# Patient Record
Sex: Female | Born: 1995 | Race: Black or African American | Hispanic: No | Marital: Single | Smoking: Never smoker
Health system: Southern US, Community
[De-identification: ages and names within clinical notes are randomized; demographics above are authoritative.]

## PROBLEM LIST (undated history)

## (undated) DIAGNOSIS — F419 Anxiety disorder, unspecified: Secondary | ICD-10-CM

## (undated) DIAGNOSIS — K219 Gastro-esophageal reflux disease without esophagitis: Secondary | ICD-10-CM

## (undated) HISTORY — DX: Anxiety disorder, unspecified: F41.9

---
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
## 2015-12-27 ENCOUNTER — Emergency Department (HOSPITAL_COMMUNITY)

## 2015-12-27 ENCOUNTER — Encounter (HOSPITAL_COMMUNITY): Payer: Self-pay | Admitting: Emergency Medicine

## 2015-12-27 ENCOUNTER — Emergency Department (HOSPITAL_COMMUNITY)
Admission: EM | Admit: 2015-12-27 | Discharge: 2015-12-27 | Disposition: A | Discharge status: Home / Self Care | Attending: Emergency Medicine | Admitting: Emergency Medicine

## 2015-12-27 DIAGNOSIS — N83209 Unspecified ovarian cyst, unspecified side: Secondary | ICD-10-CM | POA: Diagnosis not present

## 2015-12-27 DIAGNOSIS — N898 Other specified noninflammatory disorders of vagina: Secondary | ICD-10-CM | POA: Insufficient documentation

## 2015-12-27 DIAGNOSIS — R1012 Left upper quadrant pain: Secondary | ICD-10-CM | POA: Diagnosis present

## 2015-12-27 DIAGNOSIS — R1030 Lower abdominal pain, unspecified: Secondary | ICD-10-CM

## 2015-12-27 LAB — CBC
HCT: 38.7 % (ref 36.0–46.0)
Hemoglobin: 12.5 g/dL (ref 12.0–15.0)
MCH: 29.7 pg (ref 26.0–34.0)
MCHC: 32.3 g/dL (ref 30.0–36.0)
MCV: 91.9 fL (ref 78.0–100.0)
PLATELETS: 300 10*3/uL (ref 150–400)
RBC: 4.21 MIL/uL (ref 3.87–5.11)
RDW: 13.9 % (ref 11.5–15.5)
WBC: 6.6 10*3/uL (ref 4.0–10.5)

## 2015-12-27 LAB — URINALYSIS, ROUTINE W REFLEX MICROSCOPIC
BILIRUBIN URINE: NEGATIVE
Glucose, UA: NEGATIVE mg/dL
Hgb urine dipstick: NEGATIVE
KETONES UR: NEGATIVE mg/dL
NITRITE: NEGATIVE
Protein, ur: NEGATIVE mg/dL
Specific Gravity, Urine: 1.017 (ref 1.005–1.030)
pH: 7 (ref 5.0–8.0)

## 2015-12-27 LAB — COMPREHENSIVE METABOLIC PANEL
ALBUMIN: 4.2 g/dL (ref 3.5–5.0)
ALK PHOS: 54 U/L (ref 38–126)
ALT: 15 U/L (ref 14–54)
ANION GAP: 9 (ref 5–15)
AST: 22 U/L (ref 15–41)
BILIRUBIN TOTAL: 0.5 mg/dL (ref 0.3–1.2)
BUN: 11 mg/dL (ref 6–20)
CALCIUM: 9.7 mg/dL (ref 8.9–10.3)
CO2: 24 mmol/L (ref 22–32)
Chloride: 106 mmol/L (ref 101–111)
Creatinine, Ser: 0.92 mg/dL (ref 0.44–1.00)
GFR calc Af Amer: >60 mL/min (ref >60)
GFR calc non Af Amer: >60 mL/min (ref >60)
GLUCOSE: 89 mg/dL (ref 65–99)
Potassium: 3.4 mmol/L — ABNORMAL LOW (ref 3.5–5.1)
Sodium: 139 mmol/L (ref 135–145)
TOTAL PROTEIN: 7.4 g/dL (ref 6.5–8.1)

## 2015-12-27 LAB — URINE MICROSCOPIC-ADD ON: RBC / HPF: NONE SEEN RBC/hpf (ref 0–5)

## 2015-12-27 LAB — I-STAT BETA HCG BLOOD, ED (MC, WL, AP ONLY)

## 2015-12-27 LAB — WET PREP, GENITAL
Clue Cells Wet Prep HPF POC: NONE SEEN
Sperm: NONE SEEN
Trich, Wet Prep: NONE SEEN
Yeast Wet Prep HPF POC: NONE SEEN

## 2015-12-27 LAB — LIPASE, BLOOD: Lipase: 40 U/L (ref 11–51)

## 2015-12-27 MED ORDER — HYDROCODONE-ACETAMINOPHEN 5-325 MG PO TABS: 1.0000 | ORAL_TABLET | Freq: Four times a day (QID) | ORAL | 0 refills | Status: AC | PRN

## 2015-12-27 MED ORDER — MORPHINE SULFATE (PF) 4 MG/ML IV SOLN
4.0000 mg | Freq: Once | INTRAVENOUS | Status: AC
  Administered 2015-12-27: 4 mg via INTRAVENOUS
  Filled 2015-12-27: qty 1

## 2015-12-27 MED ORDER — IOPAMIDOL (ISOVUE-300) INJECTION 61%
INTRAVENOUS | Status: AC
  Administered 2015-12-27: 100 mL
  Filled 2015-12-27: qty 100

## 2015-12-27 MED ORDER — MORPHINE SULFATE (PF) 4 MG/ML IV SOLN
4.0000 mg | Freq: Once | INTRAVENOUS | Status: AC
Start: 2015-12-27 — End: 2015-12-27
  Administered 2015-12-27: 4 mg via INTRAVENOUS
  Filled 2015-12-27: qty 1

## 2015-12-27 MED ORDER — ONDANSETRON HCL 4 MG/2ML IJ SOLN
4.0000 mg | Freq: Once | INTRAMUSCULAR | Status: AC
  Administered 2015-12-27: 4 mg via INTRAVENOUS
  Filled 2015-12-27: qty 2

## 2015-12-27 MED ORDER — NAPROXEN 500 MG PO TABS: 500.0000 mg | ORAL_TABLET | Freq: Two times a day (BID) | ORAL | 0 refills | Status: AC

## 2015-12-27 MED ORDER — GI COCKTAIL ~~LOC~~
30.0000 mL | Freq: Once | ORAL | Status: AC
  Administered 2015-12-27: 30 mL via ORAL
  Filled 2015-12-27: qty 30

## 2015-12-27 MED ORDER — SODIUM CHLORIDE 0.9 % IV BOLUS (SEPSIS)
1000.0000 mL | Freq: Once | INTRAVENOUS | Status: AC
  Administered 2015-12-27: 1000 mL via INTRAVENOUS

## 2015-12-27 MED ORDER — KETOROLAC TROMETHAMINE 15 MG/ML IJ SOLN
15.0000 mg | Freq: Once | INTRAMUSCULAR | Status: AC
  Administered 2015-12-27: 15 mg via INTRAVENOUS
  Filled 2015-12-27: qty 1

## 2015-12-27 NOTE — ED Provider Notes (Signed)
[facility]-EMERGENCY DEPT Provider Note   CSN: [id number] Arrival date & time: 12/27/15  [id number]  First Provider Contact:  First MD Initiated Contact with Patient 12/27/15 1001        History   Chief Complaint Chief Complaint  Patient presents with  . Abdominal Pain    HPI Pam Henson is a 20 y.o. female.  HPI Previously healthy 20 year old female who presents with a one-day history of severe generalized abdominal pain. The patient states she was in her usual state of health yesterday. She played in a lacrosse game and denies any issues. Denies any trauma to the abdomen. Upon awakening this morning she reports acute onset of generalized, primarily left upper quadrant and periumbilical abdominal pain. She describes the pain as an aching, cramping sensation. She had a bowel movement that was normal with no improvement in her pain. She tried to eat something and this made her pain worse. She subsequently presents for further evaluation. Denies any fevers or chills. Denies any current lower abdominal pain. Denies any urinary frequency or dysuria but has noticed that her urine has been darker. No vaginal bleeding or discharge.  Past Medical History:  Diagnosis Date  . Acid reflux   . Anxiety     There are no active problems to display for this patient.   History reviewed. No pertinent surgical history.  OB History    No data available       Home Medications    Prior to Admission medications   Medication Sig Start Date End Date Taking? Authorizing Provider  ibuprofen (ADVIL,MOTRIN) 200 MG tablet Take 200 mg by mouth every 6 (six) hours as needed for moderate pain.   Yes Historical Provider, MD  naproxen sodium (ANAPROX) 220 MG tablet Take 220 mg by mouth 2 (two) times daily as needed (pain).   Yes Historical Provider, MD  HYDROcodone-acetaminophen (NORCO/VICODIN) 5-325 MG tablet Take 1 tablet by mouth every 6 (six) hours as needed for severe pain. 12/27/15   Shaune Pollackameron Keyonte Cookston, MD  naproxen  (NAPROSYN) 500 MG tablet Take 1 tablet (500 mg total) by mouth 2 (two) times daily. 12/27/15 01/01/16  Shaune Pollackameron Burdett Pinzon, MD    Family History Family History  Problem Relation Age of Onset  . Cancer Paternal Grandfather     Social History Social History  Substance Use Topics  . Smoking status: Never Smoker  . Smokeless tobacco: Never Used  . Alcohol use No     Allergies   Penicillins; Zomig [zolmitriptan]; and Sulfur   Review of Systems Review of Systems  Constitutional: Positive for chills and fatigue. Negative for fever.  HENT: Negative for congestion and rhinorrhea.   Eyes: Negative for visual disturbance.  Respiratory: Negative for cough, shortness of breath and wheezing.   Cardiovascular: Negative for chest pain and leg swelling.  Gastrointestinal: Positive for abdominal pain. Negative for diarrhea, nausea and vomiting.  Genitourinary: Negative for dyspareunia, dysuria, flank pain, pelvic pain, vaginal bleeding and vaginal discharge.  Musculoskeletal: Negative for neck pain and neck stiffness.  Skin: Negative for rash and wound.  Allergic/Immunologic: Negative for immunocompromised state.  Neurological: Negative for syncope, weakness and headaches.  All other systems reviewed and are negative.    Physical Exam Updated Vital Signs BP 107/67   Pulse (!) 47   Temp 98.3 F (36.8 C) (Oral)   Resp 18   Ht 5' (1.524 m)   Wt 120 lb (54.4 kg)   LMP 12/21/2015 (Exact Date)   SpO2 100%   BMI 23.44 kg/m  Physical Exam  Constitutional: She appears well-developed and well-nourished. She appears distressed.  HENT:  Head: Normocephalic.  Mouth/Throat: Oropharynx is clear and moist. No oropharyngeal exudate.  Eyes: Conjunctivae are normal. Pupils are equal, round, and reactive to light.  Neck: Neck supple.  Cardiovascular: Normal rate.  Exam reveals no friction rub.   No murmur heard. Pulmonary/Chest: Effort normal. No respiratory distress. She has no wheezes. She has no  rales.  Abdominal: Soft. Normal appearance and bowel sounds are normal. There is generalized tenderness and tenderness in the right lower quadrant and periumbilical area. There is guarding and tenderness at McBurney's point. There is no rigidity, no rebound, no CVA tenderness and negative Murphy's sign. No hernia.  Genitourinary: Pelvic exam was performed with patient supine. There is no lesion on the right labia. There is no lesion on the left labia. Uterus is not tender. Cervix exhibits no motion tenderness, no discharge and no friability. Right adnexum displays tenderness. Right adnexum displays no mass. Left adnexum displays tenderness. Left adnexum displays no mass. Vaginal discharge (scant, physiologic) found.  Musculoskeletal: She exhibits no edema.  Neurological: She is alert. She exhibits normal muscle tone.  Skin: Skin is warm.  Nursing note and vitals reviewed.    ED Treatments / Results  Labs (all labs ordered are listed, but only abnormal results are displayed) Labs Reviewed  WET PREP, GENITAL - Abnormal; Notable for the following:       Result Value   WBC, Wet Prep HPF POC MANY (*)    All other components within normal limits  COMPREHENSIVE METABOLIC PANEL - Abnormal; Notable for the following:    Potassium 3.4 (*)    All other components within normal limits  URINALYSIS, ROUTINE W REFLEX MICROSCOPIC (NOT AT [facility]) - Abnormal; Notable for the following:    Leukocytes, UA SMALL (*)    All other components within normal limits  URINE MICROSCOPIC-ADD ON - Abnormal; Notable for the following:    Squamous Epithelial / LPF 0-5 (*)    Bacteria, UA FEW (*)    All other components within normal limits  LIPASE, BLOOD  CBC  I-STAT BETA HCG BLOOD, ED ([facility], [facility], [facility] ONLY)  GC/CHLAMYDIA PROBE AMP ([place]) NOT AT [facility]    EKG  EKG Interpretation None       Radiology US Transvaginal Non-ob  Result Date: 12/27/2015 CLINICAL DATA:  Lower abdominal pain beginning this morning.  EXAM: TRANSABDOMINAL AND TRANSVAGINAL ULTRASOUND OF PELVIS DOPPLER ULTRASOUND OF OVARIES TECHNIQUE: Both transabdominal and transvaginal ultrasound examinations of the pelvis were performed. Transabdominal technique was performed for global imaging of the pelvis including uterus, ovaries, adnexal regions, and pelvic cul-de-sac. It was necessary to proceed with endovaginal exam following the transabdominal exam to visualize the uterus, endometrium and ovaries. Color and duplex Doppler ultrasound was utilized to evaluate blood flow to the ovaries. COMPARISON:  Current abdomen and pelvis CT FINDINGS: Uterus Measurements: 5.3 x 3.1 x 4.7 cm. Uterus retroverted. No uterine mass or fibroid. No abnormal echogenicity. Endometrium Thickness: 6.4 mm.  No focal abnormality visualized. Right ovary Measurements: 2.8 x 1.4 x 1.5 cm. Normal appearance/no adnexal mass. Left ovary Measurements: 3.4 x 1.5 x 4.3 cm. Normal appearance/no adnexal mass. Pulsed Doppler evaluation of both ovaries demonstrates normal low-resistance arterial and venous waveforms. Other findings Moderate amount free fluid. There is some echogenic foci within the fluid. IMPRESSION: 1. Small amount of pelvic free fluid, mildly complicated with some internal echogenic debris. Although the ovaries appear normal, the possibility of  a ruptured ovarian cyst should be considered. 2. No other abnormality. Normal uterus. Normal appearance of the ovaries. No torsion. Electronically Signed   By: Amie Portland M.D.   On: 12/27/2015 14:43   US Pelvis Complete  Result Date: 12/27/2015 CLINICAL DATA:  Lower abdominal pain beginning this morning. EXAM: TRANSABDOMINAL AND TRANSVAGINAL ULTRASOUND OF PELVIS DOPPLER ULTRASOUND OF OVARIES TECHNIQUE: Both transabdominal and transvaginal ultrasound examinations of the pelvis were performed. Transabdominal technique was performed for global imaging of the pelvis including uterus, ovaries, adnexal regions, and pelvic cul-de-sac.  It was necessary to proceed with endovaginal exam following the transabdominal exam to visualize the uterus, endometrium and ovaries. Color and duplex Doppler ultrasound was utilized to evaluate blood flow to the ovaries. COMPARISON:  Current abdomen and pelvis CT FINDINGS: Uterus Measurements: 5.3 x 3.1 x 4.7 cm. Uterus retroverted. No uterine mass or fibroid. No abnormal echogenicity. Endometrium Thickness: 6.4 mm.  No focal abnormality visualized. Right ovary Measurements: 2.8 x 1.4 x 1.5 cm. Normal appearance/no adnexal mass. Left ovary Measurements: 3.4 x 1.5 x 4.3 cm. Normal appearance/no adnexal mass. Pulsed Doppler evaluation of both ovaries demonstrates normal low-resistance arterial and venous waveforms. Other findings Moderate amount free fluid. There is some echogenic foci within the fluid. IMPRESSION: 1. Small amount of pelvic free fluid, mildly complicated with some internal echogenic debris. Although the ovaries appear normal, the possibility of a ruptured ovarian cyst should be considered. 2. No other abnormality. Normal uterus. Normal appearance of the ovaries. No torsion. Electronically Signed   By: Amie Portland M.D.   On: 12/27/2015 14:43   Ct Abdomen Pelvis W Contrast  Result Date: 12/27/2015 CLINICAL DATA:  Epigastric pain with nausea since this morning. No surgical history. EXAM: CT ABDOMEN AND PELVIS WITH CONTRAST TECHNIQUE: Multidetector CT imaging of the abdomen and pelvis was performed using the standard protocol following bolus administration of intravenous contrast. CONTRAST:  ISOVUE-300 IOPAMIDOL (ISOVUE-300) INJECTION 61% COMPARISON:  None. FINDINGS: Lung bases:  Clear.  Heart normal size. Liver, spleen, gallbladder, pancreas, adrenal glands:  Normal. Kidneys, ureters, bladder: Several small nonobstructing stones in the kidneys. No renal masses. No hydronephrosis. Normal ureters. Bladder is unremarkable. Uterus and adnexa: Normal. Trace pelvic free fluid, likely physiologic.  Lymph nodes:  No adenopathy. Gastrointestinal: Stomach and small bowel are unremarkable. Mild generalized increased stool throughout the colon. No colonic wall thickening or inflammatory changes. Normal appendix visualized. Musculoskeletal:  Normal. IMPRESSION: 1. No acute findings. 2. Mild increased stool throughout the colon. No bowel inflammatory changes. Normal appendix visualized. 3. Small nonobstructing stones in the kidneys. 4. No other abnormalities. Electronically Signed   By: Amie Portland M.D.   On: 12/27/2015 12:35   [place] Art/ven Flow Abd Pelv Doppler  Result Date: 12/27/2015 CLINICAL DATA:  Lower abdominal pain beginning this morning. EXAM: TRANSABDOMINAL AND TRANSVAGINAL ULTRASOUND OF PELVIS DOPPLER ULTRASOUND OF OVARIES TECHNIQUE: Both transabdominal and transvaginal ultrasound examinations of the pelvis were performed. Transabdominal technique was performed for global imaging of the pelvis including uterus, ovaries, adnexal regions, and pelvic cul-de-sac. It was necessary to proceed with endovaginal exam following the transabdominal exam to visualize the uterus, endometrium and ovaries. Color and duplex Doppler ultrasound was utilized to evaluate blood flow to the ovaries. COMPARISON:  Current abdomen and pelvis CT FINDINGS: Uterus Measurements: 5.3 x 3.1 x 4.7 cm. Uterus retroverted. No uterine mass or fibroid. No abnormal echogenicity. Endometrium Thickness: 6.4 mm.  No focal abnormality visualized. Right ovary Measurements: 2.8 x 1.4 x 1.5 cm. Normal appearance/no  adnexal mass. Left ovary Measurements: 3.4 x 1.5 x 4.3 cm. Normal appearance/no adnexal mass. Pulsed Doppler evaluation of both ovaries demonstrates normal low-resistance arterial and venous waveforms. Other findings Moderate amount free fluid. There is some echogenic foci within the fluid. IMPRESSION: 1. Small amount of pelvic free fluid, mildly complicated with some internal echogenic debris. Although the ovaries appear normal, the  possibility of a ruptured ovarian cyst should be considered. 2. No other abnormality. Normal uterus. Normal appearance of the ovaries. No torsion. Electronically Signed   By: Amie Portland M.D.   On: 12/27/2015 14:43    Procedures Procedures (including critical care time)  Medications Ordered in ED Medications  sodium chloride 0.9 % bolus 1,000 mL (0 mLs Intravenous Stopped 12/27/15 1209)  morphine 4 MG/ML injection 4 mg (4 mg Intravenous Given 12/27/15 1028)  ondansetron (ZOFRAN) injection 4 mg (4 mg Intravenous Given 12/27/15 1027)  morphine 4 MG/ML injection 4 mg (4 mg Intravenous Given 12/27/15 1145)  gi cocktail (Maalox,Lidocaine,Donnatal) (30 mLs Oral Given 12/27/15 1146)  iopamidol (ISOVUE-300) 61 % injection (100 mLs  Contrast Given 12/27/15 1214)  ondansetron (ZOFRAN) injection 4 mg (4 mg Intravenous Given 12/27/15 1145)  ketorolac (TORADOL) 15 MG/ML injection 15 mg (15 mg Intravenous Given 12/27/15 1307)     Initial Impression / Assessment and Plan / ED Course  I have reviewed the triage vital signs and the nursing notes.  Pertinent labs & imaging results that were available during my care of the patient were reviewed by me and considered in my medical decision making (see chart for details).  Clinical Course  Value Comment By Time  MCHC: 32.3 (Reviewed) Shaune Pollack, MD 08/10 1028  WBC: 6.6 Reassuring, though sx only present for several hours Shaune Pollack, MD 08/10 1029  Potassium: (!) 3.4 Mild, will replete once able to tolerate PO Shaune Pollack, MD 08/10 1029  Lipase: 40 Doubt pancreatitis Shaune Pollack, MD 08/10 1030  CT Abdomen Pelvis W Contrast (Reviewed) Shaune Pollack, MD 08/10 [phone]    20 yo F with PMHx of GERD, anxiety who p/w severe, diffuse abdominal pain. Pt guarding and in moderate distress on exam. Initial DDx includes: gastritis, pancreatitis, appendicitis, colitis given relation with bowel movement. Must also consider UTI with bladder spasm. No CVAT or signs  of pyelo. Pelvic exam c/f possible cyst, ovarian pathology and will need eval though no cervical friability, CMT, fever, or signs of PID or TOA. UPT negative. Given severity of pain and undifferentiated exam, CT ordered. Lab work pending.  CT scan negative so TVUS obtained, which shows likely ruptured cyst. Normal flow to both ovaries with no signs of torsion. Lab work otherwise reassuring with normal WBC, renal function, and LFTs. Sx markedly improved with toradol, IVF. Will discharge with NSAIDs, brief course of analgesics, and d/c home.  Final Clinical Impressions(s) / ED Diagnoses   Final diagnoses:  Lower abdominal pain  Cyst of ovary, unspecified laterality    New Prescriptions Discharge Medication List as of 12/27/2015  3:00 PM    START taking these medications   Details  HYDROcodone-acetaminophen (NORCO/VICODIN) 5-325 MG tablet Take 1 tablet by mouth every 6 (six) hours as needed for severe pain., Starting Thu 12/27/2015, Print    naproxen (NAPROSYN) 500 MG tablet Take 1 tablet (500 mg total) by mouth 2 (two) times daily., Starting Thu 12/27/2015, Until Tue 01/01/2016, Print         Shaune Pollack, MD 12/27/15 [phone]

## 2015-12-27 NOTE — ED Triage Notes (Signed)
Pt from home via [facility] with c/o LLQ pain starting this morning around 7 am with mild nausea.  Pt denies V/D or injury.  Normal BM this morning.  Pt in NAD, A&O.

## 2015-12-28 LAB — GC/CHLAMYDIA PROBE AMP (~~LOC~~) NOT AT ARMC
CHLAMYDIA, DNA PROBE: NEGATIVE
NEISSERIA GONORRHEA: NEGATIVE

## 2018-02-06 IMAGING — CT CT ABD-PELV W/ CM
2 of 4 series · 12 of 46 positions shown, 14 images · IV contrast (iopamidol)
Comparison: None.

CLINICAL DATA: Epigastric pain with nausea since this morning. No
surgical history.

EXAM:
CT ABDOMEN AND PELVIS WITH CONTRAST
TECHNIQUE: Multidetector CT imaging of the abdomen and pelvis was performed
using the standard protocol following bolus administration of
intravenous contrast.
CONTRAST:  100mL M5PZKJ-HMM IOPAMIDOL (M5PZKJ-HMM) INJECTION 61%

[Series 201: routine, idose (2) · axial · 0.78mm/px · z∈[-433,-113]mm · 9 of 80 slices shown, 11 images]
[im 8/80  soft-tissue]
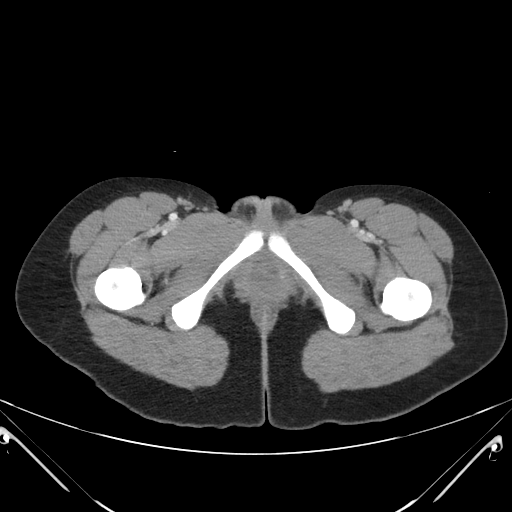
[im 8/80  bone]
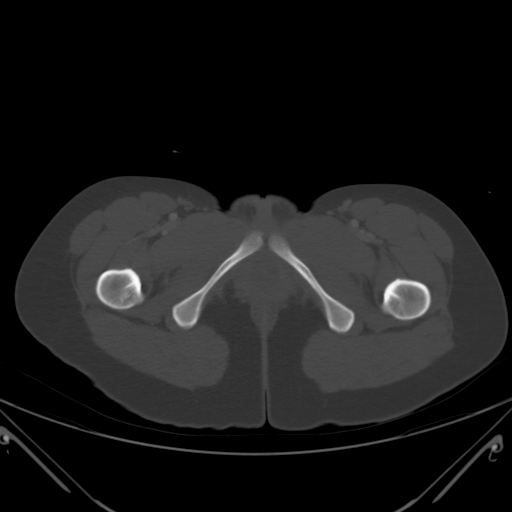
[im 15/80  soft-tissue]
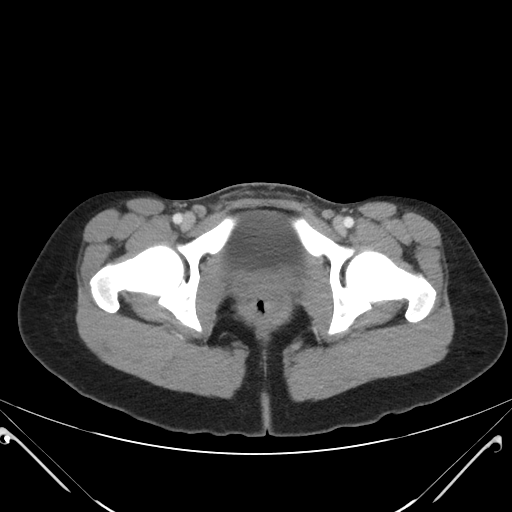
[im 22/80  soft-tissue]
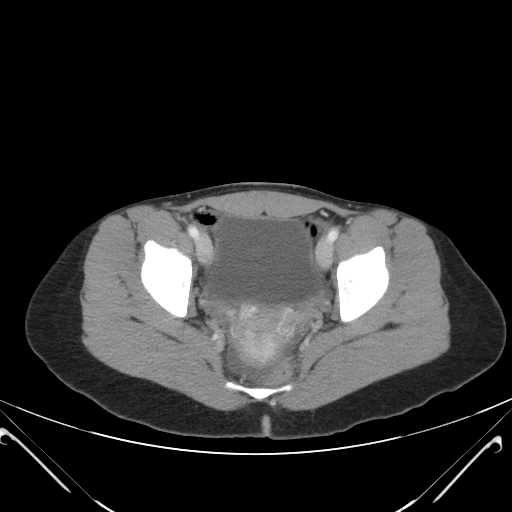
[im 33/80  soft-tissue]
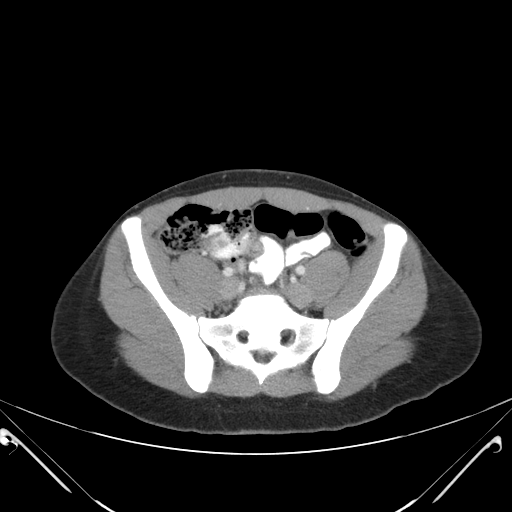
[im 40/80  soft-tissue]
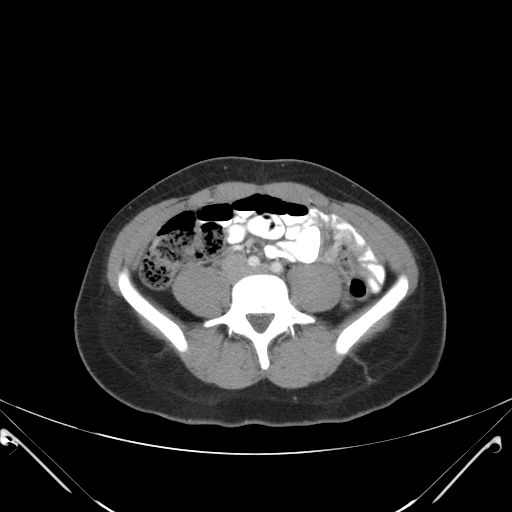
[im 47/80  soft-tissue]
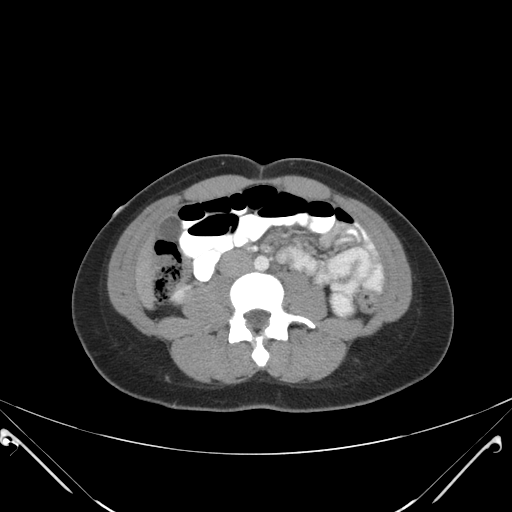
[im 58/80  soft-tissue]
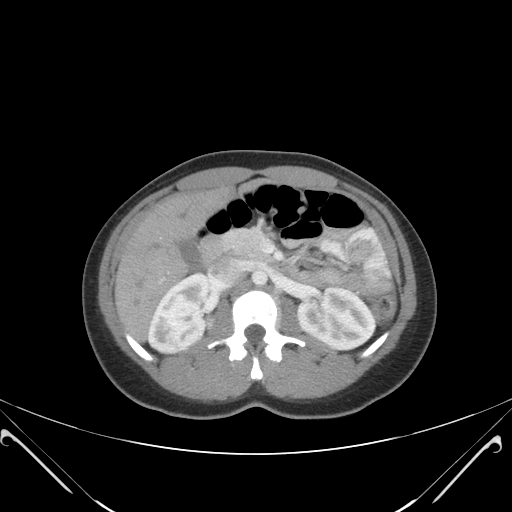
[im 65/80  soft-tissue]
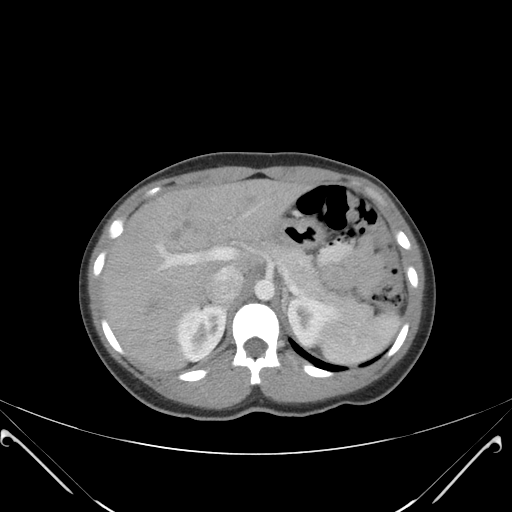
[im 72/80  soft-tissue]
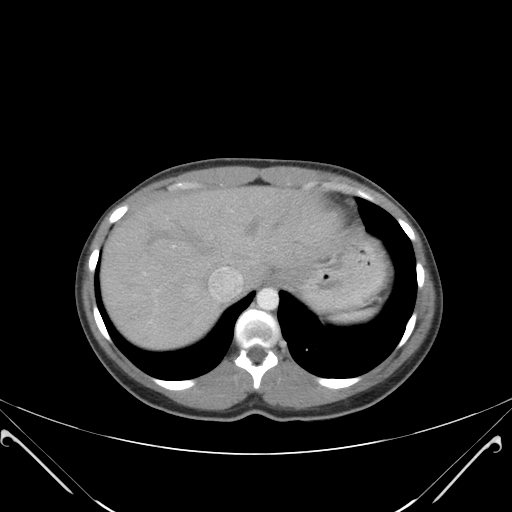
[im 72/80  bone]
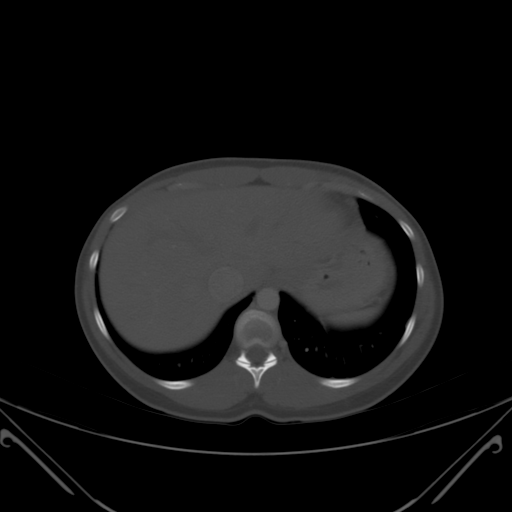

[Series 203: coronals, idose (2) · coronal · 0.45mm/px · 3 of 101 slices shown]
[im 34/101  soft-tissue]
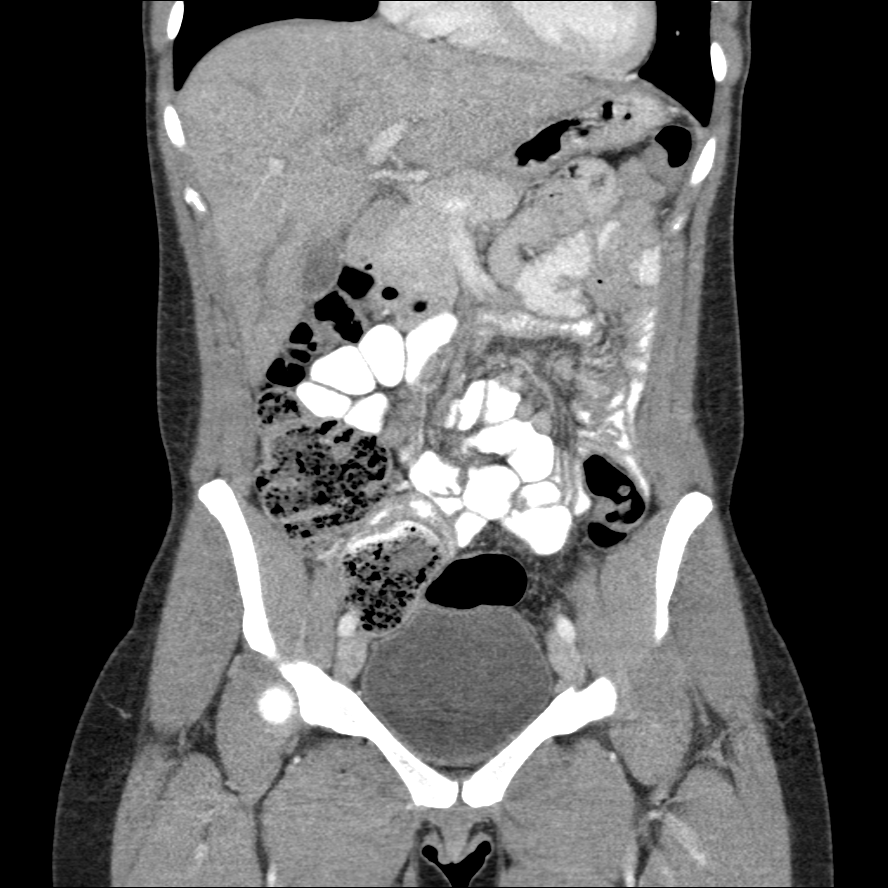
[im 45/101  soft-tissue]
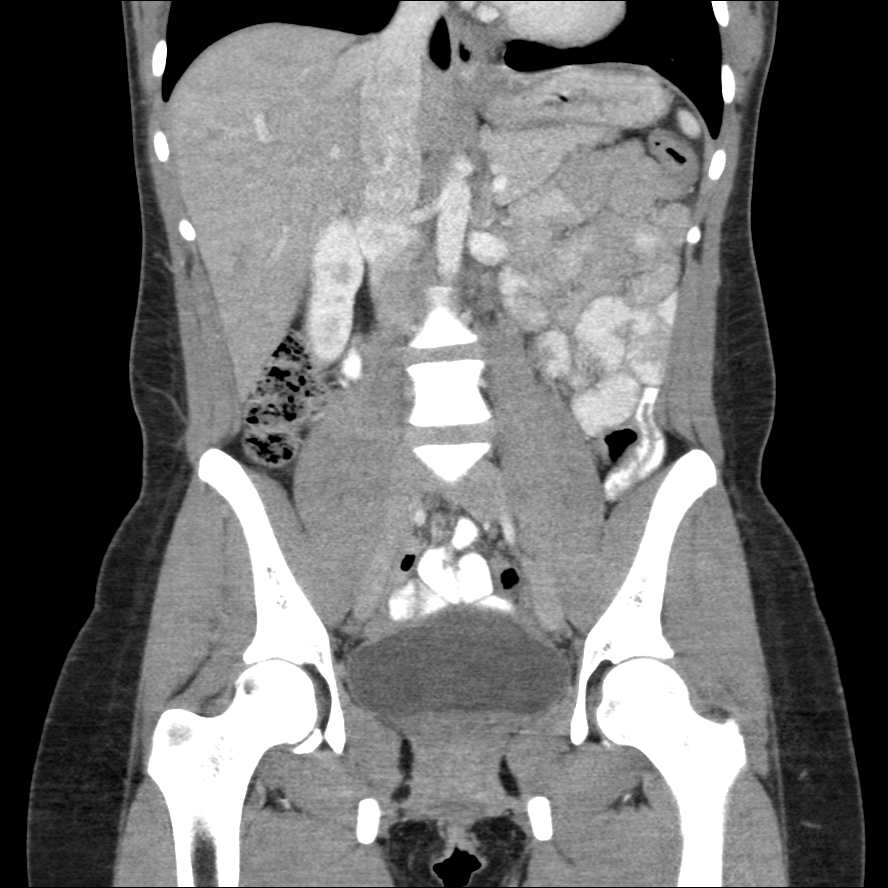
[im 56/101  soft-tissue]
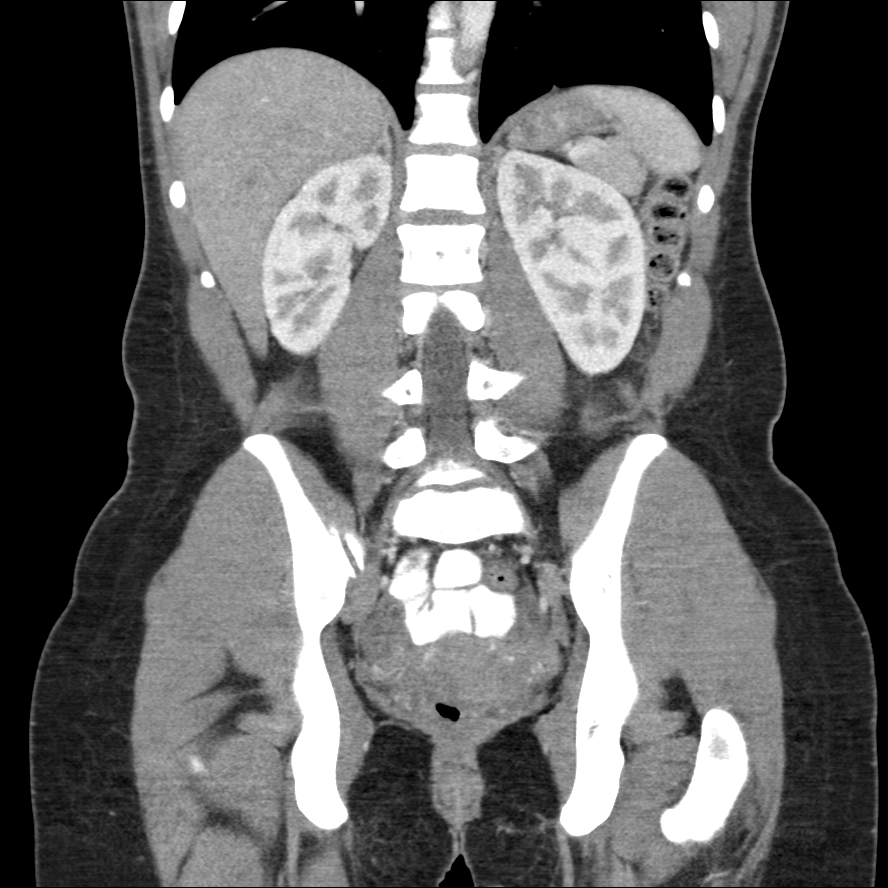

[12 of 46 positions shown; findings below may reference images not displayed]

FINDINGS: Lung bases:  Clear.  Heart normal size.

Liver, spleen, gallbladder, pancreas, adrenal glands:  Normal.

Kidneys, ureters, bladder: Several small nonobstructing stones in
the kidneys. No renal masses. No hydronephrosis. Normal ureters.
Bladder is unremarkable.

Uterus and adnexa: Normal. Trace pelvic free fluid, likely
physiologic.

Lymph nodes:  No adenopathy.

Gastrointestinal: Stomach and small bowel are unremarkable. Mild
generalized increased stool throughout the colon. No colonic wall
thickening or inflammatory changes. Normal appendix visualized.

Musculoskeletal:  Normal.
IMPRESSION: 1. No acute findings.
2. Mild increased stool throughout the colon. No bowel inflammatory
changes. Normal appendix visualized.
3. Small nonobstructing stones in the kidneys.
4. No other abnormalities.

## 2019-04-07 ENCOUNTER — Other Ambulatory Visit: Payer: Self-pay | Admitting: Cardiology

## 2019-04-07 ENCOUNTER — Encounter: Payer: Self-pay | Admitting: Cardiology

## 2019-04-07 DIAGNOSIS — Z20822 Contact with and (suspected) exposure to covid-19: Secondary | ICD-10-CM

## 2019-04-11 LAB — NOVEL CORONAVIRUS, NAA: SARS-CoV-2, NAA: NOT DETECTED
